# Patient Record
Sex: Male | Born: 1995 | Race: Black or African American | Hispanic: No | Marital: Single | Smoking: Current some day smoker
Health system: Southern US, Community
[De-identification: ages and names within clinical notes are randomized; demographics above are authoritative.]

## PROBLEM LIST (undated history)

## (undated) DIAGNOSIS — M8560 Other cyst of bone, unspecified site: Secondary | ICD-10-CM

## (undated) DIAGNOSIS — S42309A Unspecified fracture of shaft of humerus, unspecified arm, initial encounter for closed fracture: Secondary | ICD-10-CM

## (undated) DIAGNOSIS — R51 Headache: Secondary | ICD-10-CM

## (undated) DIAGNOSIS — R519 Headache, unspecified: Secondary | ICD-10-CM

## (undated) DIAGNOSIS — R011 Cardiac murmur, unspecified: Secondary | ICD-10-CM

## (undated) HISTORY — DX: Headache: R51

## (undated) HISTORY — DX: Cardiac murmur, unspecified: R01.1

## (undated) HISTORY — DX: Other cyst of bone, unspecified site: M85.60

## (undated) HISTORY — DX: Headache, unspecified: R51.9

## (undated) HISTORY — DX: Unspecified fracture of shaft of humerus, unspecified arm, initial encounter for closed fracture: S42.309A

---
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
## 2018-09-17 ENCOUNTER — Emergency Department (HOSPITAL_COMMUNITY)
Admission: EM | Admit: 2018-09-17 | Discharge: 2018-09-17 | Disposition: A | Discharge status: Home / Self Care | Payer: BLUE CROSS/BLUE SHIELD | Attending: Emergency Medicine | Admitting: Emergency Medicine

## 2018-09-17 ENCOUNTER — Encounter (HOSPITAL_COMMUNITY): Payer: Self-pay | Admitting: Emergency Medicine

## 2018-09-17 ENCOUNTER — Other Ambulatory Visit: Payer: Self-pay

## 2018-09-17 DIAGNOSIS — F410 Panic disorder [episodic paroxysmal anxiety] without agoraphobia: Secondary | ICD-10-CM | POA: Diagnosis not present

## 2018-09-17 MED ORDER — LORAZEPAM 0.5 MG PO TABS: 1.0000 mg | ORAL_TABLET | Freq: Three times a day (TID) | ORAL | 0 refills | Status: AC | PRN

## 2018-09-17 NOTE — ED Triage Notes (Signed)
Pt states he has a panic attack that started after he got out of work, with tingling on his hands and numbness on his face, pt is AO x 4 no neuro deficit noticed.

## 2018-09-17 NOTE — Discharge Instructions (Signed)
It is important to get a primary care provider for recheck
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
## 2018-09-28 ENCOUNTER — Encounter: Payer: Self-pay | Admitting: Family Medicine

## 2018-09-28 ENCOUNTER — Ambulatory Visit (INDEPENDENT_AMBULATORY_CARE_PROVIDER_SITE_OTHER): Payer: BLUE CROSS/BLUE SHIELD | Admitting: Family Medicine

## 2018-09-28 DIAGNOSIS — F411 Generalized anxiety disorder: Secondary | ICD-10-CM | POA: Diagnosis not present

## 2018-09-28 DIAGNOSIS — F410 Panic disorder [episodic paroxysmal anxiety] without agoraphobia: Secondary | ICD-10-CM | POA: Insufficient documentation

## 2018-09-28 MED ORDER — CITALOPRAM HYDROBROMIDE 20 MG PO TABS: 40.0000 mg | ORAL_TABLET | Freq: Every day | ORAL | 3 refills | Status: DC

## 2018-09-28 NOTE — Patient Instructions (Signed)
It was very nice to see you today!  Please take Celexa 20 mg daily.  Do this for a couple of weeks.  If you do well, you can then increase to 2 pills daily.  Please let me know if you have any side effects.  Come back to see me in 4 to 6 weeks, or sooner as needed.  Take care, Dr Jimmey Ralph

## 2018-09-28 NOTE — Assessment & Plan Note (Addendum)
History consistent with panic attacks and his GAD score is significantly elevated consistent with generalized anxiety disorder.  His MDQ is negative.  Discussed treatment options with patient.  We will start Celexa 20 mg daily for the next 1 to 2 weeks, then increase to 40 mg daily.  He has some Ativan on hand in case he has any recurrent panic attacks.  Discussed potential side effects from the Celexa.  He will follow-up with me in 4 to 6 weeks.  Discussed he has a therapist already-advised him to follow-up closely with them to discuss psychotherapy options.  Discussed reasons to return to care and seek emergent care.

## 2018-09-28 NOTE — Progress Notes (Signed)
Subjective:  Jacob Ross is a 23 y.o. male who presents today with a chief complaint of anxiety and to establish care.   HPI:  Anxiety / Panic Attacks, new problem to provider Thinks that symptoms started 4 years ago.  He has always felt anxious and on edge.  He has had several episodes where he had difficulty breathing with chest pain, and perioral numbness.  He has been to the emergency department a few times for these episodes and was diagnosed with panic attacks.  Most recent panic attack occurred 11 days ago.  He went to the emergency department where he underwent full cardiac work-up which was negative.  He was sent home with a few Ativan.  Thankfully, he has not had any recurrence of his symptoms since then.  He also reports having some underlying depressive symptoms as well.  He has never been on any medications for depression or anxiety.  No obvious triggering or precipitating events.  No other obvious alleviating or aggravating factors.  GAD 7 : Generalized Anxiety Score 09/28/2018  Nervous, Anxious, on Edge 3  Control/stop worrying 3  Worry too much - different things 2  Trouble relaxing 2  Restless 0  Easily annoyed or irritable 0  Afraid - awful might happen 1  Total GAD 7 Score 11  Anxiety Difficulty Not difficult at all   ROS: Per HPI, otherwise a complete review of systems was negative.   PMH:  The following were reviewed and entered/updated in epic: Past Medical History:  Diagnosis Date  . Cyst of bone    right humerus with procedule [employer] age 39years of age   . Headache   . Heart murmur    hx of cards eval reported benign and no fu neededper pat  . Humerus fracture    Patient Active Problem List   Diagnosis Date Noted  . Generalized anxiety disorder with panic attacks 09/28/2018   Past Surgical History:  Procedure Laterality Date  . arm surgery  2016   right elbow    Family History  Problem Relation Age of Onset  . Hypertension  Paternal Grandmother   . Arthritis Paternal Grandfather   . Arthritis Maternal Grandmother   . Anxiety disorder Father     Medications- reviewed and updated Current Outpatient Medications  Medication Sig Dispense Refill  . LORazepam (ATIVAN) 0.5 MG tablet Take 2 tablets (1 mg total) by mouth 3 (three) times daily as needed for anxiety. 5 tablet 0  . citalopram (CELEXA) 20 MG tablet Take 2 tablets (40 mg total) by mouth daily. 60 tablet 3   No current facility-administered medications for this visit.     Allergies-reviewed and updated Allergies  Allergen Reactions  . Shellfish Allergy Rash    Hives     Social History   Socioeconomic History  . Marital status: Single    Spouse name: Not on file  . Number of children: Not on file  . Years of education: Not on file  . Highest education level: Not on file  Occupational History  . Not on file  Social Needs  . Financial resource strain: Not on file  . Food insecurity:    Worry: Not on file    Inability: Not on file  . Transportation needs:    Medical: Not on file    Non-medical: Not on file  Tobacco Use  . Smoking status: Current Some Day Smoker  . Smokeless tobacco: Never Used  Substance and Sexual Activity  .  Alcohol use: Yes    Alcohol/week: 0.0 standard drinks    Comment: Occasional  . Drug use: Never  . Sexual activity: Not on file  Lifestyle  . Physical activity:    Days per week: Not on file    Minutes per session: Not on file  . Stress: Not on file  Relationships  . Social connections:    Talks on phone: Not on file    Gets together: Not on file    Attends religious service: Not on file    Active member of club or organization: Not on file    Attends meetings of clubs or organizations: Not on file    Relationship status: Not on file  Other Topics Concern  . Not on file  Social History Narrative   Parents divorced lives with father and step mom except on every other weekend /   [employer]     [employer]   Neg tobacco  etoh    HH of 6    employed as a Production assistant, radioserver.    Objective:  Physical Exam: BP 110/62 (BP Location: Left Arm, Patient Position: Sitting, Cuff Size: Normal)   Pulse 61   Temp 98.4 F (36.9 C) (Oral)   Ht 6\' 2"  (1.88 m)   Wt 138 lb 6.4 oz (62.8 kg)   SpO2 98%   BMI 17.77 kg/m   Gen: NAD, resting comfortably CV: RRR with no murmurs appreciated Pulm: NWOB, CTAB with no crackles, wheezes, or rhonchi GI: Normal bowel sounds present. Soft, Nontender, Nondistended. MSK: No edema, cyanosis, or clubbing noted Skin: Warm, dry Neuro: Grossly normal, moves all extremities Psych: Normal affect and thought content  Assessment/Plan:  Generalized anxiety disorder with panic attacks History consistent with panic attacks and his GAD score is significantly elevated consistent with generalized anxiety disorder.  His MDQ is negative.  Discussed treatment options with patient.  We will start Celexa 20 mg daily for the next 1 to 2 weeks, then increase to 40 mg daily.  He has some Ativan on hand in case he has any recurrent panic attacks.  Discussed potential side effects from the Celexa.  He will follow-up with me in 4 to 6 weeks.  Discussed he has a therapist already-advised him to follow-up closely with them to discuss psychotherapy options.  Discussed reasons to return to care and seek emergent care.  Katina Degreealeb M. Jimmey RalphParker, MD 09/28/2018 12:24 PM

## 2018-10-27 ENCOUNTER — Ambulatory Visit: Payer: BLUE CROSS/BLUE SHIELD | Admitting: Family Medicine

## 2018-11-02 ENCOUNTER — Ambulatory Visit: Payer: BLUE CROSS/BLUE SHIELD | Admitting: Family Medicine

## 2018-11-02 ENCOUNTER — Encounter: Payer: Self-pay | Admitting: Family Medicine

## 2018-11-07 ENCOUNTER — Other Ambulatory Visit: Payer: Self-pay

## 2018-11-07 MED ORDER — CITALOPRAM HYDROBROMIDE 20 MG PO TABS: 40.0000 mg | ORAL_TABLET | Freq: Every day | ORAL | 0 refills | Status: AC

## 2019-09-13 ENCOUNTER — Encounter: Payer: Self-pay | Admitting: Emergency Medicine

## 2019-09-13 ENCOUNTER — Other Ambulatory Visit: Payer: Self-pay

## 2019-09-13 ENCOUNTER — Ambulatory Visit
Admission: EM | Admit: 2019-09-13 | Discharge: 2019-09-13 | Disposition: A | Discharge status: Home / Self Care | Payer: BLUE CROSS/BLUE SHIELD

## 2019-09-13 DIAGNOSIS — Z4802 Encounter for removal of sutures: Secondary | ICD-10-CM

## 2019-09-13 DIAGNOSIS — S71111D Laceration without foreign body, right thigh, subsequent encounter: Secondary | ICD-10-CM

## 2019-09-13 NOTE — ED Notes (Signed)
Patient able to ambulate independently  
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
## 2019-09-13 NOTE — ED Triage Notes (Signed)
Pt presents to [facility] for assessment after 15 days of suture placement to right thigh.  Pt states 3 sutured placed.  No hx in [facility], will have provider assess.

## 2019-09-13 NOTE — Discharge Instructions (Signed)
Good to meet you! Keep skin clean/dry. Return for pain, discharge, fever.

## 2019-11-28 ENCOUNTER — Telehealth: Payer: Self-pay

## 2019-11-28 NOTE — Telephone Encounter (Signed)
Error

## 2020-07-10 ENCOUNTER — Ambulatory Visit
Admission: EM | Admit: 2020-07-10 | Discharge: 2020-07-10 | Disposition: A | Discharge status: Home / Self Care | Payer: Self-pay
